# Patient Record
Sex: Male | Born: 2000 | Race: Black or African American | Hispanic: No | Marital: Single | Smoking: Never smoker
Health system: Southern US, Community
[De-identification: ages and names within clinical notes are randomized; demographics above are authoritative.]

---
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
## 2021-08-03 ENCOUNTER — Encounter (HOSPITAL_COMMUNITY): Payer: Self-pay

## 2021-08-03 ENCOUNTER — Other Ambulatory Visit: Payer: Self-pay

## 2021-08-03 ENCOUNTER — Emergency Department (HOSPITAL_COMMUNITY)
Admission: EM | Admit: 2021-08-03 | Discharge: 2021-08-03 | Disposition: A | Discharge status: Home / Self Care | Payer: 59 | Attending: Emergency Medicine | Admitting: Emergency Medicine

## 2021-08-03 DIAGNOSIS — S0121XA Laceration without foreign body of nose, initial encounter: Secondary | ICD-10-CM | POA: Insufficient documentation

## 2021-08-03 DIAGNOSIS — S0992XA Unspecified injury of nose, initial encounter: Secondary | ICD-10-CM | POA: Diagnosis present

## 2021-08-03 DIAGNOSIS — Y92411 Interstate highway as the place of occurrence of the external cause: Secondary | ICD-10-CM | POA: Diagnosis not present

## 2021-08-03 DIAGNOSIS — R040 Epistaxis: Secondary | ICD-10-CM
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
